# Patient Record
Sex: Male | Born: 1980 | Race: White | Hispanic: No | Marital: Married | Smoking: Current every day smoker
Health system: Southern US, Community
[De-identification: ages and names within clinical notes are randomized; demographics above are authoritative.]

## PROBLEM LIST (undated history)

## (undated) DIAGNOSIS — E119 Type 2 diabetes mellitus without complications: Secondary | ICD-10-CM

## (undated) DIAGNOSIS — M797 Fibromyalgia: Secondary | ICD-10-CM

## (undated) DIAGNOSIS — F259 Schizoaffective disorder, unspecified: Secondary | ICD-10-CM

## (undated) DIAGNOSIS — F1910 Other psychoactive substance abuse, uncomplicated: Secondary | ICD-10-CM

## (undated) DIAGNOSIS — F250 Schizoaffective disorder, bipolar type: Secondary | ICD-10-CM

---

## 2016-11-14 ENCOUNTER — Emergency Department (HOSPITAL_BASED_OUTPATIENT_CLINIC_OR_DEPARTMENT_OTHER): Payer: Medicare Other

## 2016-11-14 ENCOUNTER — Encounter (HOSPITAL_BASED_OUTPATIENT_CLINIC_OR_DEPARTMENT_OTHER): Payer: Self-pay | Admitting: *Deleted

## 2016-11-14 ENCOUNTER — Emergency Department (HOSPITAL_BASED_OUTPATIENT_CLINIC_OR_DEPARTMENT_OTHER)
Admission: EM | Admit: 2016-11-14 | Discharge: 2016-11-14 | Disposition: A | Discharge status: Home / Self Care | Payer: Medicare Other | Source: Home / Self Care | Attending: Emergency Medicine | Admitting: Emergency Medicine

## 2016-11-14 ENCOUNTER — Emergency Department (HOSPITAL_BASED_OUTPATIENT_CLINIC_OR_DEPARTMENT_OTHER)
Admission: EM | Admit: 2016-11-14 | Discharge: 2016-11-15 | Disposition: A | Discharge status: Home / Self Care | Payer: Medicare Other | Attending: Emergency Medicine | Admitting: Emergency Medicine

## 2016-11-14 DIAGNOSIS — R509 Fever, unspecified: Secondary | ICD-10-CM | POA: Insufficient documentation

## 2016-11-14 DIAGNOSIS — F17200 Nicotine dependence, unspecified, uncomplicated: Secondary | ICD-10-CM | POA: Insufficient documentation

## 2016-11-14 DIAGNOSIS — F2089 Other schizophrenia: Secondary | ICD-10-CM

## 2016-11-14 DIAGNOSIS — R062 Wheezing: Secondary | ICD-10-CM

## 2016-11-14 DIAGNOSIS — F1910 Other psychoactive substance abuse, uncomplicated: Secondary | ICD-10-CM | POA: Insufficient documentation

## 2016-11-14 DIAGNOSIS — R55 Syncope and collapse: Secondary | ICD-10-CM

## 2016-11-14 DIAGNOSIS — Z7984 Long term (current) use of oral hypoglycemic drugs: Secondary | ICD-10-CM | POA: Diagnosis not present

## 2016-11-14 DIAGNOSIS — R0789 Other chest pain: Secondary | ICD-10-CM

## 2016-11-14 DIAGNOSIS — R4020 Unspecified coma: Secondary | ICD-10-CM

## 2016-11-14 DIAGNOSIS — E119 Type 2 diabetes mellitus without complications: Secondary | ICD-10-CM

## 2016-11-14 HISTORY — DX: Fibromyalgia: M79.7

## 2016-11-14 HISTORY — DX: Other psychoactive substance abuse, uncomplicated: F19.10

## 2016-11-14 HISTORY — DX: Schizoaffective disorder, unspecified: F25.9

## 2016-11-14 HISTORY — DX: Schizoaffective disorder, bipolar type: F25.0

## 2016-11-14 HISTORY — DX: Type 2 diabetes mellitus without complications: E11.9

## 2016-11-14 LAB — COMPREHENSIVE METABOLIC PANEL
ALT: 39 U/L (ref 17–63)
ANION GAP: 8 (ref 5–15)
AST: 40 U/L (ref 15–41)
Albumin: 3.9 g/dL (ref 3.5–5.0)
Alkaline Phosphatase: 76 U/L (ref 38–126)
BUN: 11 mg/dL (ref 6–20)
CHLORIDE: 106 mmol/L (ref 101–111)
CO2: 26 mmol/L (ref 22–32)
Calcium: 8.8 mg/dL — ABNORMAL LOW (ref 8.9–10.3)
Creatinine, Ser: 1.01 mg/dL (ref 0.61–1.24)
Glucose, Bld: 155 mg/dL — ABNORMAL HIGH (ref 65–99)
POTASSIUM: 3.2 mmol/L — AB (ref 3.5–5.1)
Sodium: 140 mmol/L (ref 135–145)
TOTAL PROTEIN: 7.2 g/dL (ref 6.5–8.1)
Total Bilirubin: 0.5 mg/dL (ref 0.3–1.2)

## 2016-11-14 LAB — CBC WITH DIFFERENTIAL/PLATELET
Basophils Absolute: 0 10*3/uL (ref 0.0–0.1)
Basophils Absolute: 0 10*3/uL (ref 0.0–0.1)
Basophils Relative: 0 %
Basophils Relative: 0 %
EOS PCT: 0 %
EOS PCT: 0 %
Eosinophils Absolute: 0 10*3/uL (ref 0.0–0.7)
Eosinophils Absolute: 0 10*3/uL (ref 0.0–0.7)
HCT: 43.1 % (ref 39.0–52.0)
HCT: 42.1 % (ref 39.0–52.0)
Hemoglobin: 14.5 g/dL (ref 13.0–17.0)
Hemoglobin: 14 g/dL (ref 13.0–17.0)
LYMPHS ABS: 2.3 10*3/uL (ref 0.7–4.0)
LYMPHS PCT: 17 %
Lymphocytes Relative: 14 %
Lymphs Abs: 1.8 10*3/uL (ref 0.7–4.0)
MCH: 29.4 pg (ref 26.0–34.0)
MCH: 29.4 pg (ref 26.0–34.0)
MCHC: 33.6 g/dL (ref 30.0–36.0)
MCHC: 33.3 g/dL (ref 30.0–36.0)
MCV: 87.2 fL (ref 78.0–100.0)
MCV: 88.3 fL (ref 78.0–100.0)
MONO ABS: 1.6 10*3/uL — AB (ref 0.1–1.0)
Monocytes Absolute: 1.3 10*3/uL — ABNORMAL HIGH (ref 0.1–1.0)
Monocytes Relative: 10 %
Monocytes Relative: 12 %
NEUTROS ABS: 7.5 10*3/uL (ref 1.7–7.7)
NEUTROS PCT: 71 %
Neutro Abs: 12.4 10*3/uL — ABNORMAL HIGH (ref 1.7–7.7)
Neutrophils Relative %: 76 %
PLATELETS: 162 10*3/uL (ref 150–400)
PLATELETS: 153 10*3/uL (ref 150–400)
RBC: 4.94 MIL/uL (ref 4.22–5.81)
RBC: 4.77 MIL/uL (ref 4.22–5.81)
RDW: 13.4 % (ref 11.5–15.5)
RDW: 13.6 % (ref 11.5–15.5)
WBC: 16.3 10*3/uL — AB (ref 4.0–10.5)
WBC: 10.7 10*3/uL — AB (ref 4.0–10.5)

## 2016-11-14 LAB — URINALYSIS, ROUTINE W REFLEX MICROSCOPIC
BILIRUBIN URINE: NEGATIVE
Glucose, UA: NEGATIVE mg/dL
Hgb urine dipstick: NEGATIVE
KETONES UR: NEGATIVE mg/dL
Leukocytes, UA: NEGATIVE
NITRITE: NEGATIVE
PROTEIN: NEGATIVE mg/dL
pH: 6.5 (ref 5.0–8.0)

## 2016-11-14 LAB — BASIC METABOLIC PANEL
ANION GAP: 10 (ref 5–15)
BUN: 15 mg/dL (ref 6–20)
CALCIUM: 9.1 mg/dL (ref 8.9–10.3)
CO2: 24 mmol/L (ref 22–32)
CREATININE: 1.24 mg/dL (ref 0.61–1.24)
Chloride: 102 mmol/L (ref 101–111)
GFR calc Af Amer: >60 mL/min (ref >60)
GFR calc non Af Amer: >60 mL/min (ref >60)
GLUCOSE: 93 mg/dL (ref 65–99)
Potassium: 3.8 mmol/L (ref 3.5–5.1)
Sodium: 136 mmol/L (ref 135–145)

## 2016-11-14 LAB — RAPID URINE DRUG SCREEN, HOSP PERFORMED
Amphetamines: NOT DETECTED
BARBITURATES: NOT DETECTED
BENZODIAZEPINES: NOT DETECTED
Cocaine: NOT DETECTED
Opiates: POSITIVE — AB
Tetrahydrocannabinol: NOT DETECTED

## 2016-11-14 LAB — PROTIME-INR
INR: 1
PROTHROMBIN TIME: 13.1 s (ref 11.4–15.2)

## 2016-11-14 LAB — I-STAT CG4 LACTIC ACID, ED: LACTIC ACID, VENOUS: 1.61 mmol/L (ref 0.5–1.9)

## 2016-11-14 MED ORDER — IPRATROPIUM-ALBUTEROL 0.5-2.5 (3) MG/3ML IN SOLN
3.0000 mL | Freq: Four times a day (QID) | RESPIRATORY_TRACT | Status: DC
  Administered 2016-11-14: 3 mL via RESPIRATORY_TRACT
  Filled 2016-11-14: qty 3

## 2016-11-14 MED ORDER — NAPROXEN 500 MG PO TABS: 500.0000 mg | ORAL_TABLET | Freq: Two times a day (BID) | ORAL | 0 refills | Status: AC

## 2016-11-14 MED ORDER — ACETAMINOPHEN 500 MG PO TABS
1000.0000 mg | ORAL_TABLET | Freq: Once | ORAL | Status: AC
  Administered 2016-11-14: 1000 mg via ORAL
  Filled 2016-11-14: qty 2

## 2016-11-14 MED ORDER — SODIUM CHLORIDE 0.9 % IV BOLUS (SEPSIS)
1000.0000 mL | Freq: Once | INTRAVENOUS | Status: AC
  Administered 2016-11-14: 1000 mL via INTRAVENOUS

## 2016-11-14 MED ORDER — ALBUTEROL SULFATE HFA 108 (90 BASE) MCG/ACT IN AERS
2.0000 | INHALATION_SPRAY | Freq: Once | RESPIRATORY_TRACT | Status: AC
  Administered 2016-11-14: 2 via RESPIRATORY_TRACT
  Filled 2016-11-14: qty 6.7

## 2016-11-14 MED ORDER — ALBUTEROL SULFATE HFA 108 (90 BASE) MCG/ACT IN AERS: 2.0000 | INHALATION_SPRAY | RESPIRATORY_TRACT | 0 refills | Status: AC | PRN

## 2016-11-14 NOTE — ED Triage Notes (Signed)
Pt states " my whole chest is riping apart" CPR preformed x 1 day ago , drug overdose and AMA from [facility]

## 2016-11-14 NOTE — ED Notes (Signed)
ED Provider at bedside. 

## 2016-11-14 NOTE — ED Provider Notes (Signed)
[facility] Provider [facility]   CSN: [id number] Arrival date & time: 11/14/16  2203     History   Chief Complaint Chief Complaint  Patient presents with  . Chest wall pain post CPR    HPI Omar Hebert is a 36 y.o. male.  The history is provided by the patient and the spouse.  Chest Pain   This is a new problem. The current episode started 12 to 24 hours ago. The problem occurs constantly. The problem has been gradually worsening. The pain is moderate. The symptoms are aggravated by certain positions and deep breathing. Associated symptoms include cough and a fever. Pertinent negatives include no hemoptysis, no syncope and no vomiting. He has tried nothing for the symptoms.  Pertinent negatives for past medical history include no CAD, no PE and no seizures.  Patient with h/o diabetes, previous IVDA presents with chest wall pain due to recent CPR Apparently he had episodes of unresponsiveness on 9/4 and 9/5, and required CPR by family There was concern for overdose by heroin, which patient denies but he had opiates on UDS and has known h/o IVDA  Today, no syncope No unresponsiveness since last ER visit He has had fever/cough/chest wall pain worse with breathing/cough No hemoptysis  Past Medical History:  Diagnosis Date  . Diabetes mellitus without complication (HCC)   . Fibromyalgia   . IV drug abuse   . Polysubstance abuse   . Schizo-affective schizophrenia (HCC)     There are no active problems to display for this patient.   History reviewed. No pertinent surgical history.     Home Medications    Prior to Admission medications   Medication Sig Start Date End Date Taking? Authorizing Provider  carbamazepine (TEGRETOL XR) 100 MG 12 hr tablet Take 100 mg by mouth 2 (two) times daily.   Yes [provider]  metFORMIN (GLUCOPHAGE) 1000 MG tablet Take 1,000 mg by mouth daily with breakfast.   Yes [provider]  OLANZapine (ZYPREXA) 10 MG  tablet Take 300 mg by mouth at bedtime.   Yes [provider]  albuterol (PROVENTIL HFA;VENTOLIN HFA) 108 (90 Base) MCG/ACT inhaler Inhale 2 puffs into the lungs every 4 (four) hours as needed for wheezing or shortness of breath. 11/14/16   Horton, Mayer Masker, MD  gabapentin (NEURONTIN) 300 MG capsule Take 300 mg by mouth 3 (three) times daily.    [provider]  naproxen (NAPROSYN) 500 MG tablet Take 1 tablet (500 mg total) by mouth 2 (two) times daily. 11/14/16   Horton, Mayer Masker, MD  topiramate (TOPAMAX) 100 MG tablet Take 100 mg by mouth 2 (two) times daily.    [provider]    Family History No family history on file.  Social History Social History  Substance Use Topics  . Smoking status: Current Every Day Smoker    Packs/day: 1.00  . Smokeless tobacco: Not on file  . Alcohol use No     Allergies   Patient has no known allergies.   Review of Systems Review of Systems  Constitutional: Positive for fatigue and fever.  Respiratory: Positive for cough. Negative for hemoptysis.   Cardiovascular: Positive for chest pain. Negative for syncope.  Gastrointestinal: Negative for vomiting.  Skin: Negative for rash.  Neurological: Negative for seizures and syncope.  All other systems reviewed and are negative.    Physical Exam Updated Vital Signs BP 133/86   Pulse (!) 107   Temp (!) 100.8 F (38.2 C) (Oral)  Resp 18   SpO2 97%   Physical Exam CONSTITUTIONAL: Disheveled, no acute distress HEAD: Normocephalic/atraumatic EYES: EOMI/PERRL ENMT: Mucous membranes moist, uvula midline, no erythema/exudates NECK: supple no meningeal signs SPINE/BACK:entire spine nontender CV: S1/S2 noted, no murmurs/rubs/gallops noted, no harsh murmurs noted LUNGS: wheezing bilaterally ABDOMEN: soft, nontender, no rebound or guarding, bowel sounds noted throughout abdomen GU:no cva tenderness NEURO: Pt is awake/alert/appropriate, moves all extremitiesx4.  No facial  droop.   EXTREMITIES: pulses normal/equal, full ROM, ?track marks to arms/feet but no cellulitis/abscesses noted SKIN: warm, color normal PSYCH:anxious  ED Treatments / Results  Labs (all labs ordered are listed, but only abnormal results are displayed) Labs Reviewed  COMPREHENSIVE METABOLIC PANEL - Abnormal; Notable for the following:       Result Value   Potassium 3.2 (*)    Glucose, Bld 155 (*)    Calcium 8.8 (*)    All other components within normal limits  CBC WITH DIFFERENTIAL/PLATELET - Abnormal; Notable for the following:    WBC 10.7 (*)    Monocytes Absolute 1.3 (*)    All other components within normal limits  URINALYSIS, ROUTINE W REFLEX MICROSCOPIC - Abnormal; Notable for the following:    Specific Gravity, Urine <1.005 (*)    All other components within normal limits  CULTURE, BLOOD (ROUTINE X 2)  CULTURE, BLOOD (ROUTINE X 2)  PROTIME-INR  I-STAT CG4 LACTIC ACID, ED    EKG  EKG Interpretation  Date/Time:  Wednesday November 14 2016 22:17:52 EDT Ventricular Rate:  105 PR Interval:    QRS Duration: 83 QT Interval:  341 QTC Calculation: 451 R Axis:   27 Text Interpretation:  Sinus tachycardia Probable left atrial enlargement Borderline T abnormalities, anterior leads Confirmed by Zadie Rhine ([id number]) on 11/14/2016 11:07:03 PM       Radiology Dg Chest 2 View  Result Date: 11/14/2016 CLINICAL DATA:  Pt was unresponsive went to [facility] 1 day ago, left AMA and had another episode of unresponsiveness at home where Mother and wife performed CPR on him. Pt was seen early this AM for post CPR treatment. Patient now states that his whole chest feels like it is ripping apart. Feels like he has broken ribs. History of drug use, schizophrenia, diabetes, fibromyalgia, smoker. EXAM: CHEST  2 VIEW COMPARISON:  11/14/2016 at 0540 hours FINDINGS: Normal heart size and pulmonary vascularity. No focal airspace disease or consolidation in the lungs. No blunting of  costophrenic angles. No pneumothorax. Mediastinal contours appear intact. IMPRESSION: No active cardiopulmonary disease. Electronically Signed   By: Burman Nieves M.D.   On: 11/14/2016 23:15   Dg Chest 2 View  Result Date: 11/14/2016 CLINICAL DATA:  Multiple nonresponsive episodes over 2 days. Mid chest pain. Cough. Smoker. EXAM: CHEST  2 VIEW COMPARISON:  11/13/2016 from [facility] FINDINGS: The heart size and mediastinal contours are within normal limits. Both lungs are clear. The visualized skeletal structures are unremarkable. IMPRESSION: No active cardiopulmonary disease. Electronically Signed   By: Burman Nieves M.D.   On: 11/14/2016 06:15    Procedures Procedures (including critical care time)  Medications Ordered in ED Medications  ipratropium-albuterol (DUONEB) 0.5-2.5 (3) MG/3ML nebulizer solution 3 mL (3 mLs Nebulization Given 11/14/16 2322)  acetaminophen (TYLENOL) tablet 1,000 mg (1,000 mg Oral Given 11/14/16 2227)  sodium chloride 0.9 % bolus 1,000 mL (0 mLs Intravenous Stopped 11/15/16 0046)  ketorolac (TORADOL) 30 MG/ML injection 30 mg (30 mg Intravenous Given 11/15/16 0014)  cefTRIAXone (ROCEPHIN) 1 g in  dextrose 5 % 50 mL IVPB (0 g Intravenous Stopped 11/15/16 0046)  azithromycin (ZITHROMAX) tablet 500 mg (500 mg Oral Given 11/15/16 0014)     Initial Impression / Assessment and Plan / ED Course  I have reviewed the triage vital signs and the nursing notes.  Pertinent labs & imaging results that were available during my care of the patient were reviewed by me and considered in my medical decision making (see chart for details).     Pt stable He is improved He is ambulatory, no distress No hypoxia He is here for chest wall pain from recent CPR, but no rib fractures However given fever/cough/wheezing, likely respiratory infectious etiology Will start on antibiotics for CAP Pt is very unreliable, denying use of IV heroin when it has been well  documented However, he is insisting on leaving, he is awake/alert, and appears appropriate and nontoxic  We discussed strict ER return precautions   Final Clinical Impressions(s) / ED Diagnoses   Final diagnoses:  Chest wall pain  Acute febrile illness    New Prescriptions New Prescriptions   AZITHROMYCIN (ZITHROMAX) 250 MG TABLET    One tablet PO daily for 4 days     Zadie RhineWickline, Luvia Orzechowski, MD 11/15/16 [phone]

## 2016-11-14 NOTE — Discharge Instructions (Signed)
You were seen today after an episode of loss of consciousness and reported CPR. He had chest wall pain. Your chest x-ray and EKG are reassuring. Take naproxen as needed for pain. Your loss of consciousness baby related to opiate abuse. Avoid street drugs as they can make you unintentionally overdosed and worsen your schizophrenia. You were noted to have some wheezing. Use inhaler every 4 hours as needed.

## 2016-11-14 NOTE — ED Triage Notes (Addendum)
Pt states he was unresponsive yesterday at 1500, went to [facility],  Left [facility] went home went to sleep and became unresponsive  Wife and mother did cpr on him  Went back to [facility], left [facility] and came here,  States also has cough, ha, sore on inside of mouth and feels like broken ribs   Pt a&o  Ambulatory without diff

## 2016-11-14 NOTE — ED Provider Notes (Signed)
[facility]   CSN: [id number] Arrival date & time: 11/14/16  0430     History   Chief Complaint Chief Complaint  Patient presents with  . Loss of Consciousness    HPI Omar Hebert is a 36 y.o. male.  HPI  This is a 36 year old male with history of diabetes, schizophrenia who presents with reported loss of consciousness. Patient reports 2 episodes of loss of consciousness.  Initial episode was yesterday at 3 PM. He was evaluated by EMS. Minimally responsive to Narcan. [facility]. Unfortunately, patient left AGAINST MEDICAL ADVICE prior to definitive care. [facility] he was in no acute distress. Breathing on his own. He removed his own IV and left because he was not happy with his care. He reports that he went home and had another episode of unresponsiveness prompting his wife to give him CPR. [facility]. He sat in the waiting room for greater than 7 hours per the patient. He had no recurrence of symptoms while there. He left because "they wouldn't do anything for me." Currently he is complaining of chest discomfort. He thinks that he may have broken some ribs. Denies any shortness of breath. Initially, patient denied any recent drug use. However, upon further questioning, patient did do heroin prior to the initial episode of loss of consciousness.  Patient with history of schizophrenia. Reports taking his medications. Denies hallucinations. Denies suicidal ideation or homicidal ideation.  Past Medical History:  Diagnosis Date  . Diabetes mellitus without complication (HCC)   . Fibromyalgia   . Schizo-affective schizophrenia (HCC)     There are no active problems to display for this patient.   No past surgical history on file.     Home Medications    Prior to Admission medications   Medication Sig Start Date End Date Taking? Authorizing Provider  gabapentin (NEURONTIN) 300 MG capsule  Take 300 mg by mouth 3 (three) times daily.   Yes [provider]  topiramate (TOPAMAX) 100 MG tablet Take 100 mg by mouth 2 (two) times daily.   Yes [provider]  albuterol (PROVENTIL HFA;VENTOLIN HFA) 108 (90 Base) MCG/ACT inhaler Inhale 2 puffs into the lungs every 4 (four) hours as needed for wheezing or shortness of breath. 11/14/16   Tiani Stanbery, Mayer Maskerourtney F, MD  naproxen (NAPROSYN) 500 MG tablet Take 1 tablet (500 mg total) by mouth 2 (two) times daily. 11/14/16   Oshua Mcconaha, Mayer Maskerourtney F, MD    Family History No family history on file.  Social History Social History  Substance Use Topics  . Smoking status: Not on file  . Smokeless tobacco: Not on file  . Alcohol use Not on file     Allergies   Patient has no known allergies.   Review of Systems Review of Systems  Constitutional: Negative for fever.  Respiratory: Positive for wheezing. Negative for shortness of breath.   Cardiovascular: Positive for chest pain.  Gastrointestinal: Negative for abdominal pain and vomiting.  Genitourinary: Negative for dysuria.  Neurological: Negative for seizures.       Loss of consciousness  Psychiatric/Behavioral: Negative for hallucinations and suicidal ideas. The patient is nervous/anxious.   All other systems reviewed and are negative.    Physical Exam Updated Vital Signs BP (!) 141/89 (BP Location: Right Arm)   Pulse 98   Temp 99 F (37.2 C) (Oral)   Resp 16   Ht 5\' 9"  (1.753 m)  Wt 105.2 kg (232 lb)   SpO2 96%   BMI 34.26 kg/m   Physical Exam  Constitutional: He is oriented to person, place, and time. No distress.  Disheveled appearing, overweight, no acute distress  HENT:  Head: Normocephalic and atraumatic.  Eyes: Pupils are equal, round, and reactive to light.  Pupils 3 mm and reactive bilaterally  Neck: Neck supple.  Cardiovascular: Normal rate, regular rhythm and normal heart sounds.   No murmur heard. Pulmonary/Chest: Effort normal. No respiratory  distress. He has wheezes. He exhibits tenderness.  Diffuse expiratory wheezing, no crepitus  Abdominal: Soft. Bowel sounds are normal. There is no tenderness. There is no rebound.  Musculoskeletal: He exhibits no edema.  Neurological: He is alert and oriented to person, place, and time.  Cranial nerves II through XII intact, 5 out of 5 strength in all 4 extremities  Skin: Skin is warm and dry.  Psychiatric:  Bizarre affect, tangential speech  Nursing note and vitals reviewed.    ED Treatments / Results  Labs (all labs ordered are listed, but only abnormal results are displayed) Labs Reviewed  RAPID URINE DRUG SCREEN, HOSP PERFORMED - Abnormal; Notable for the following:       Result Value   Opiates POSITIVE (*)    All other components within normal limits  CBC WITH DIFFERENTIAL/PLATELET - Abnormal; Notable for the following:    WBC 16.3 (*)    Neutro Abs 12.4 (*)    Monocytes Absolute 1.6 (*)    All other components within normal limits  BASIC METABOLIC PANEL    EKG  EKG Interpretation  Date/Time:  Wednesday November 14 2016 05:39:29 EDT Ventricular Rate:  87 PR Interval:    QRS Duration: 84 QT Interval:  376 QTC Calculation: 453 R Axis:   37 Text Interpretation:  Sinus rhythm Confirmed by Ross Marcus ([id number]) on 11/14/2016 6:06:19 AM       Radiology Dg Chest 2 View  Result Date: 11/14/2016 CLINICAL DATA:  Multiple nonresponsive episodes over 2 days. Mid chest pain. Cough. Smoker. EXAM: CHEST  2 VIEW COMPARISON:  11/13/2016 from [facility] FINDINGS: The heart size and mediastinal contours are within normal limits. Both lungs are clear. The visualized skeletal structures are unremarkable. IMPRESSION: No active cardiopulmonary disease. Electronically Signed   By: Burman Nieves M.D.   On: 11/14/2016 06:15    Procedures Procedures (including critical care time)  Medications Ordered in ED Medications  ipratropium-albuterol (DUONEB) 0.5-2.5 (3)  MG/3ML nebulizer solution 3 mL (3 mLs Nebulization Given 11/14/16 0554)  albuterol (PROVENTIL HFA;VENTOLIN HFA) 108 (90 Base) MCG/ACT inhaler 2 puff (2 puffs Inhalation Given 11/14/16 0649)     Initial Impression / Assessment and Plan / ED Course  I have reviewed the triage vital signs and the nursing notes.  Pertinent labs & imaging results that were available during my care of the patient were reviewed by me and considered in my medical decision making (see chart for details).     Patient presents reporting loss of consciousness and CPR at home. He was initially evaluated at [facility]. He finally admitted to using opiates prior to the initial loss of consciousness. On physical exam he is neurologically intact. He has diffuse wheezing. Vital signs are reassuring. EKG is nonischemic. Chest x-ray shows no evidence of pneumothorax or broken ribs. Lab work is largely unremarkable. He does have a white count of 16 which is down trending from [facility]. Discussed with patient that he should avoid using  street drugs given his schizophrenia and increased risk for unintentional overdose. Patient was encouraged follow-up with his counselor.  After history, exam, and medical workup I feel the patient has been appropriately medically screened and is safe for discharge home. Pertinent diagnoses were discussed with the patient. Patient was given return precautions.   Final Clinical Impressions(s) / ED Diagnoses   Final diagnoses:  Loss of consciousness (HCC)  Polysubstance abuse  Chest wall pain  Wheezing    New Prescriptions New Prescriptions   ALBUTEROL (PROVENTIL HFA;VENTOLIN HFA) 108 (90 BASE) MCG/ACT INHALER    Inhale 2 puffs into the lungs every 4 (four) hours as needed for wheezing or shortness of breath.   NAPROXEN (NAPROSYN) 500 MG TABLET    Take 1 tablet (500 mg total) by mouth 2 (two) times daily.     Shon Baton, MD 11/14/16 [phone]

## 2016-11-14 NOTE — ED Notes (Signed)
Patient transported to X-ray 

## 2016-11-15 ENCOUNTER — Encounter (HOSPITAL_BASED_OUTPATIENT_CLINIC_OR_DEPARTMENT_OTHER): Payer: Self-pay | Admitting: *Deleted

## 2016-11-15 ENCOUNTER — Emergency Department (HOSPITAL_BASED_OUTPATIENT_CLINIC_OR_DEPARTMENT_OTHER)
Admission: EM | Admit: 2016-11-15 | Discharge: 2016-11-15 | Disposition: A | Discharge status: Home / Self Care | Payer: Medicare Other | Source: Home / Self Care

## 2016-11-15 DIAGNOSIS — Z5321 Procedure and treatment not carried out due to patient leaving prior to being seen by health care provider: Secondary | ICD-10-CM

## 2016-11-15 DIAGNOSIS — R05 Cough: Secondary | ICD-10-CM

## 2016-11-15 DIAGNOSIS — R9431 Abnormal electrocardiogram [ECG] [EKG]: Secondary | ICD-10-CM

## 2016-11-15 DIAGNOSIS — R079 Chest pain, unspecified: Secondary | ICD-10-CM

## 2016-11-15 MED ORDER — DEXTROSE 5 % IV SOLN
1.0000 g | Freq: Once | INTRAVENOUS | Status: AC
  Administered 2016-11-15: 1 g via INTRAVENOUS
  Filled 2016-11-15: qty 10

## 2016-11-15 MED ORDER — KETOROLAC TROMETHAMINE 30 MG/ML IJ SOLN
30.0000 mg | Freq: Once | INTRAMUSCULAR | Status: AC
  Administered 2016-11-15: 30 mg via INTRAVENOUS
  Filled 2016-11-15: qty 1

## 2016-11-15 MED ORDER — AZITHROMYCIN 250 MG PO TABS: ORAL_TABLET | ORAL | 0 refills | Status: AC

## 2016-11-15 MED ORDER — AZITHROMYCIN 250 MG PO TABS
500.0000 mg | ORAL_TABLET | Freq: Once | ORAL | Status: AC
  Administered 2016-11-15: 500 mg via ORAL
  Filled 2016-11-15: qty 2

## 2016-11-15 NOTE — ED Notes (Signed)
Called with no response

## 2016-11-15 NOTE — ED Notes (Signed)
ED Provider at bedside discussing test results and dispo plan of care. 

## 2016-11-15 NOTE — ED Notes (Signed)
No answer. Pt was seen walking toward the parking lot after triage.

## 2016-11-15 NOTE — ED Triage Notes (Addendum)
Cough and chest pain. He went his MD today and was told to come here for abnormal EKG. EKG repeated at triage shows NSR. No distress at triage. He had a CXR today in his doctors office.

## 2016-11-15 NOTE — ED Notes (Signed)
Called to room No answer

## 2016-11-20 LAB — CULTURE, BLOOD (ROUTINE X 2)
CULTURE: NO GROWTH
Culture: NO GROWTH
SPECIAL REQUESTS: ADEQUATE
SPECIAL REQUESTS: ADEQUATE

## 2017-07-24 ENCOUNTER — Other Ambulatory Visit: Payer: Self-pay

## 2017-07-24 ENCOUNTER — Encounter (HOSPITAL_BASED_OUTPATIENT_CLINIC_OR_DEPARTMENT_OTHER): Payer: Self-pay | Admitting: *Deleted

## 2017-07-24 ENCOUNTER — Emergency Department (HOSPITAL_BASED_OUTPATIENT_CLINIC_OR_DEPARTMENT_OTHER)
Admission: EM | Admit: 2017-07-24 | Discharge: 2017-07-24 | Disposition: A | Discharge status: Home / Self Care | Payer: Medicare Other | Attending: Emergency Medicine | Admitting: Emergency Medicine

## 2017-07-24 ENCOUNTER — Emergency Department (HOSPITAL_BASED_OUTPATIENT_CLINIC_OR_DEPARTMENT_OTHER): Payer: Medicare Other

## 2017-07-24 DIAGNOSIS — Z79899 Other long term (current) drug therapy: Secondary | ICD-10-CM | POA: Diagnosis not present

## 2017-07-24 DIAGNOSIS — E119 Type 2 diabetes mellitus without complications: Secondary | ICD-10-CM | POA: Insufficient documentation

## 2017-07-24 DIAGNOSIS — M25562 Pain in left knee: Secondary | ICD-10-CM

## 2017-07-24 DIAGNOSIS — Z7984 Long term (current) use of oral hypoglycemic drugs: Secondary | ICD-10-CM | POA: Diagnosis not present

## 2017-07-24 DIAGNOSIS — F17200 Nicotine dependence, unspecified, uncomplicated: Secondary | ICD-10-CM | POA: Diagnosis not present

## 2017-07-24 MED ORDER — KETOROLAC TROMETHAMINE 60 MG/2ML IM SOLN
30.0000 mg | Freq: Once | INTRAMUSCULAR | Status: AC
  Administered 2017-07-24: 30 mg via INTRAMUSCULAR
  Filled 2017-07-24: qty 2

## 2017-07-24 MED ORDER — LIDOCAINE 5 % EX PTCH: 1.0000 | MEDICATED_PATCH | CUTANEOUS | 0 refills | Status: AC

## 2017-07-24 NOTE — ED Provider Notes (Signed)
[facility] EMERGENCY DEPARTMENT Provider Note   CSN: [id number] Arrival date & time: 07/24/17  0228     History   Chief Complaint Chief Complaint  Patient presents with  . Leg Pain    HPI Gianno Volner is a 37 y.o. male.  The history is provided by the patient.  Leg Pain   This is a chronic problem. The current episode started more than 1 week ago (100 days ago). The problem occurs constantly. The problem has not changed since onset.The pain is present in the left knee and left lower leg. The quality of the pain is described as aching. The pain is severe. Pertinent negatives include no numbness, full range of motion, no stiffness, no tingling and no itching. The symptoms are aggravated by activity. He has tried nothing for the symptoms. The treatment provided no relief. There has been no history of extremity trauma. Family history is significant for no rheumatoid arthritis.  Knee Pain   This is a chronic problem. The current episode started more than 1 week ago. The problem occurs constantly. The problem has not changed since onset.The pain is present in the left knee. The quality of the pain is described as aching. The pain is severe. Pertinent negatives include no numbness, full range of motion, no stiffness, no tingling and no itching. He has tried nothing for the symptoms. The treatment provided no relief. There has been no history of extremity trauma. Family history is significant for no rheumatoid arthritis.    Past Medical History:  Diagnosis Date  . Diabetes mellitus without complication (HCC)   . Fibromyalgia   . IV drug abuse (HCC)   . Polysubstance abuse (HCC)   . Schizo-affective schizophrenia (HCC)     There are no active problems to display for this patient.   History reviewed. No pertinent surgical history.      Home Medications    Prior to Admission medications   Medication Sig Start Date End Date Taking? Authorizing Provider  albuterol  (PROVENTIL HFA;VENTOLIN HFA) 108 (90 Base) MCG/ACT inhaler Inhale 2 puffs into the lungs every 4 (four) hours as needed for wheezing or shortness of breath. 11/14/16   Horton, Mayer Masker, MD  azithromycin (ZITHROMAX) 250 MG tablet One tablet PO daily for 4 days 11/15/16   Zadie Rhine, MD  carbamazepine (TEGRETOL XR) 100 MG 12 hr tablet Take 100 mg by mouth 2 (two) times daily.    [provider]  gabapentin (NEURONTIN) 300 MG capsule Take 300 mg by mouth 3 (three) times daily.    [provider]  metFORMIN (GLUCOPHAGE) 1000 MG tablet Take 1,000 mg by mouth daily with breakfast.    [provider]  naproxen (NAPROSYN) 500 MG tablet Take 1 tablet (500 mg total) by mouth 2 (two) times daily. 11/14/16   Horton, Mayer Masker, MD  OLANZapine (ZYPREXA) 10 MG tablet Take 300 mg by mouth at bedtime.    [provider]  topiramate (TOPAMAX) 100 MG tablet Take 100 mg by mouth 2 (two) times daily.    [provider]    Family History History reviewed. No pertinent family history.  Social History Social History   Tobacco Use  . Smoking status: Current Every Day Smoker    Packs/day: 1.00  . Smokeless tobacco: Never Used  Substance Use Topics  . Alcohol use: No  . Drug use: No     Allergies   Patient has no known allergies.   Review of Systems Review of  Systems  Constitutional: Negative for chills and fever.  Respiratory: Negative for cough and shortness of breath.   Cardiovascular: Negative for chest pain, palpitations and leg swelling.  Genitourinary: Negative for dysuria.  Musculoskeletal: Positive for arthralgias. Negative for back pain, gait problem, joint swelling, neck pain, neck stiffness and stiffness.  Skin: Negative for color change, itching, pallor and rash.  Neurological: Negative for tingling and numbness.  All other systems reviewed and are negative.    Physical Exam Updated Vital Signs BP (!) 151/104 (BP Location: Left Arm)    Pulse 82   Temp 99 F (37.2 C) (Oral)   Resp 18   Ht  (1.727 m)   Wt 95.3 kg (210 lb)   SpO2 100%   BMI 31.93 kg/m   Physical Exam  Constitutional: He is oriented to person, place, and time. He appears well-developed and well-nourished. No distress.  HENT:  Head: Normocephalic and atraumatic.  Nose: Nose normal.  Mouth/Throat: No oropharyngeal exudate.  Eyes: Pupils are equal, round, and reactive to light. Conjunctivae are normal.  Neck: Normal range of motion. Neck supple.  Cardiovascular: Normal rate, regular rhythm, normal heart sounds and intact distal pulses.  No murmur heard. Pulmonary/Chest: Effort normal and breath sounds normal. No stridor. He has no wheezes. He has no rales.  Abdominal: Soft. Bowel sounds are normal. He exhibits no mass. There is no tenderness. There is no rebound and no guarding.  Musculoskeletal: Normal range of motion. He exhibits no tenderness or deformity.       Left hip: Normal.       Right knee: Normal.       Left knee: Normal. He exhibits normal range of motion, no swelling, no effusion, no ecchymosis, no deformity, no laceration, no erythema, normal alignment, no LCL laxity, normal patellar mobility, no bony tenderness, normal meniscus and no MCL laxity. No tenderness found. No medial joint line, no lateral joint line, no MCL, no LCL and no patellar tendon tenderness noted.       Right ankle: Normal.       Left ankle: Normal. He exhibits normal range of motion, no swelling, no ecchymosis, no deformity, no laceration and normal pulse. No tenderness. No lateral malleolus, no medial malleolus, no AITFL, no CF ligament, no posterior TFL, no head of 5th metatarsal and no proximal fibula tenderness found. Achilles tendon normal.       Right upper leg: Normal.       Left upper leg: Normal.       Right lower leg: Normal.       Left lower leg: Normal.       Right foot: Normal.       Left foot: Normal.  No wounds on the skin of either lower extremity.   No patella alta or baja.  Negative Lachmann's test B.  Negative Homan's sign.  Intact L5/s1 Intact gait 3 + dorsalis pedis b,  No osler nodes no Janeway lesions no splinter hemorrhages   Neurological: He is alert and oriented to person, place, and time. He displays normal reflexes. No sensory deficit. He exhibits normal muscle tone.  Skin: Skin is warm and dry. Capillary refill takes less than 2 seconds.  Psychiatric: He has a normal mood and affect.     ED Treatments / Results   Radiology Results for orders placed or performed during the hospital encounter of 11/14/16  Culture, blood (Routine x 2)  Result Value Ref Range   Specimen Description BLOOD BLOOD RIGHT HAND  Special Requests      BOTTLES DRAWN AEROBIC AND ANAEROBIC Blood Culture adequate volume   Culture      NO GROWTH 5 DAYS Performed at [facility], [address], [place], [place] [address]    Report Status 11/20/2016 FINAL   Culture, blood (Routine x 2)  Result Value Ref Range   Specimen Description BLOOD BLOOD LEFT FOREARM    Special Requests      BOTTLES DRAWN AEROBIC AND ANAEROBIC Blood Culture adequate volume   Culture      NO GROWTH 5 DAYS Performed at [facility], [address], [place], [place] [address]    Report Status 11/20/2016 FINAL   Comprehensive metabolic panel  Result Value Ref Range   Sodium 140 135 - 145 mmol/L   Potassium 3.2 (L) 3.5 - 5.1 mmol/L   Chloride 106 101 - 111 mmol/L   CO2 26 22 - 32 mmol/L   Glucose, Bld 155 (H) 65 - 99 mg/dL   BUN 11 6 - 20 mg/dL   Creatinine, Ser 0.98 0.61 - 1.24 mg/dL   Calcium 8.8 (L) 8.9 - 10.3 mg/dL   Total Protein 7.2 6.5 - 8.1 g/dL   Albumin 3.9 3.5 - 5.0 g/dL   AST 40 15 - 41 U/L   ALT 39 17 - 63 U/L   Alkaline Phosphatase 76 38 - 126 U/L   Total Bilirubin 0.5 0.3 - 1.2 mg/dL   GFR calc non Af Amer >60 >60 mL/min   GFR calc Af Amer >60 >60 mL/min   Anion gap 8 5 - 15  CBC with Differential  Result Value Ref Range   WBC 10.7  (H) 4.0 - 10.5 K/uL   RBC 4.77 4.22 - 5.81 MIL/uL   Hemoglobin 14.0 13.0 - 17.0 g/dL   HCT 11.9 14.7 - 82.9 %   MCV 88.3 78.0 - 100.0 fL   MCH 29.4 26.0 - 34.0 pg   MCHC 33.3 30.0 - 36.0 g/dL   RDW 56.2 13.0 - 86.5 %   Platelets 153 150 - 400 K/uL   Neutrophils Relative % 71 %   Neutro Abs 7.5 1.7 - 7.7 K/uL   Lymphocytes Relative 17 %   Lymphs Abs 1.8 0.7 - 4.0 K/uL   Monocytes Relative 12 %   Monocytes Absolute 1.3 (H) 0.1 - 1.0 K/uL   Eosinophils Relative 0 %   Eosinophils Absolute 0.0 0.0 - 0.7 K/uL   Basophils Relative 0 %   Basophils Absolute 0.0 0.0 - 0.1 K/uL  Protime-INR  Result Value Ref Range   Prothrombin Time 13.1 11.4 - 15.2 seconds   INR 1.00   Urinalysis, Routine w reflex microscopic  Result Value Ref Range   Color, Urine YELLOW YELLOW   APPearance CLEAR CLEAR   Specific Gravity, Urine <1.005 (L) 1.005 - 1.030   pH 6.5 5.0 - 8.0   Glucose, UA NEGATIVE NEGATIVE mg/dL   Hgb urine dipstick NEGATIVE NEGATIVE   Bilirubin Urine NEGATIVE NEGATIVE   Ketones, ur NEGATIVE NEGATIVE mg/dL   Protein, ur NEGATIVE NEGATIVE mg/dL   Nitrite NEGATIVE NEGATIVE   Leukocytes, UA NEGATIVE NEGATIVE  I-Stat CG4 Lactic Acid, ED  Result Value Ref Range   Lactic Acid, Venous 1.61 0.5 - 1.9 mmol/L   Dg Tibia/fibula Left  Result Date: 07/24/2017 CLINICAL DATA:  Chronic left lower leg pain. EXAM: LEFT TIBIA AND FIBULA - 2 VIEW COMPARISON:  None. FINDINGS: There is no evidence of fracture or dislocation. The  tibia and fibula appear intact. The ankle mortise is incompletely assessed, but appears grossly unremarkable. The knee joint is grossly unremarkable in appearance. No knee joint effusion is identified. No definite soft tissue abnormalities are characterized on radiograph. The subtalar joint is unremarkable in appearance. IMPRESSION: No evidence of fracture or dislocation. Electronically Signed   By: Roanna Raider M.D.   On: 07/24/2017 03:32   Dg Knee Complete 4 Views Left  Result  Date: 07/24/2017 CLINICAL DATA:  Chronic left knee pain. EXAM: LEFT KNEE - COMPLETE 4+ VIEW COMPARISON:  None. FINDINGS: There is no evidence of fracture or dislocation. The joint spaces are preserved. No significant degenerative change is seen; the patellofemoral joint is grossly unremarkable in appearance. No significant joint effusion is seen. The visualized soft tissues are normal in appearance. IMPRESSION: No evidence of fracture or dislocation. Electronically Signed   By: Roanna Raider M.D.   On: 07/24/2017 03:31    Procedures Procedures (including critical care time)  Medications Ordered in ED Medications  ketorolac (TORADOL) injection 30 mg (has no administration in time range)      Final Clinical Impressions(s) / ED Diagnoses   No effusions, no fractures no signs of infection.  Symptoms are chronic and patient has an appointment with orthopedics at this time.  Exam vitals and imaging are benign and reassuring.  Highly doubt systemic infection or thrombus.  Has an appointment tomorrow with [facility].  Keep this appointment.  Will prescribe lidoderm patches.    Return for weakness, numbness, changes in vision or speech, fevers >100.4 unrelieved by medication, shortness of breath, intractable vomiting, or diarrhea, abdominal pain, Inability to tolerate liquids or food, cough, altered mental status or any concerns. No signs of systemic illness or infection. The patient is nontoxic-appearing on exam and vital signs are within normal limits.   I have reviewed the triage vital signs and the nursing notes. Pertinent labs &imaging results that were available during my care of the patient were reviewed by me and considered in my medical decision making (see chart for details).  After history, exam, and medical workup I feel the patient has been appropriately medically screened and is safe for discharge home. Pertinent diagnoses were discussed with the patient. Patient was given  return precautions.    Deaundre Allston, MD 07/24/17 [phone]

## 2017-07-24 NOTE — ED Triage Notes (Signed)
Pt reports left leg pain x several months. Ambulatory. Denies known injury.

## 2017-07-24 NOTE — ED Notes (Addendum)
Pt c/o left thigh, outer knee and shin pain for the last 100 days.  Pt has an appointment on Thursday with ortho.  Achilles in tact, able to flex and extend both legs without issue.  No evidence of redness, no swelling, no warmth, no trauma, no bruising.

## 2020-01-06 IMAGING — DX DG KNEE COMPLETE 4+V*L*
4 series · 4 of 4 positions shown · non-contrast
Comparison: None.

CLINICAL DATA: Chronic left knee pain.

EXAM:
LEFT KNEE - COMPLETE 4+ VIEW

[knee ap]
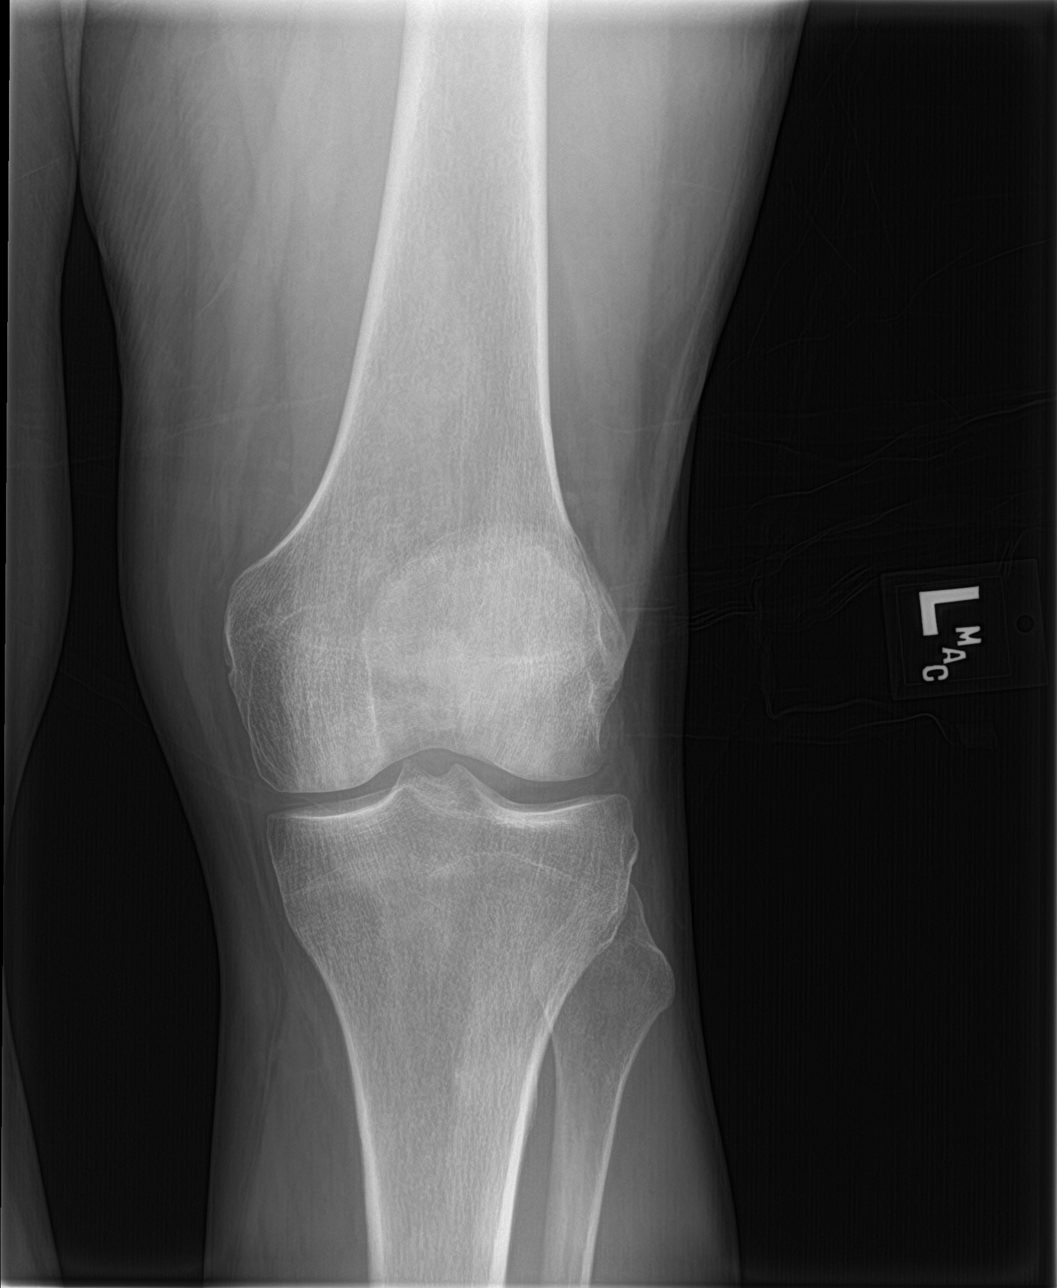

[tunnel]
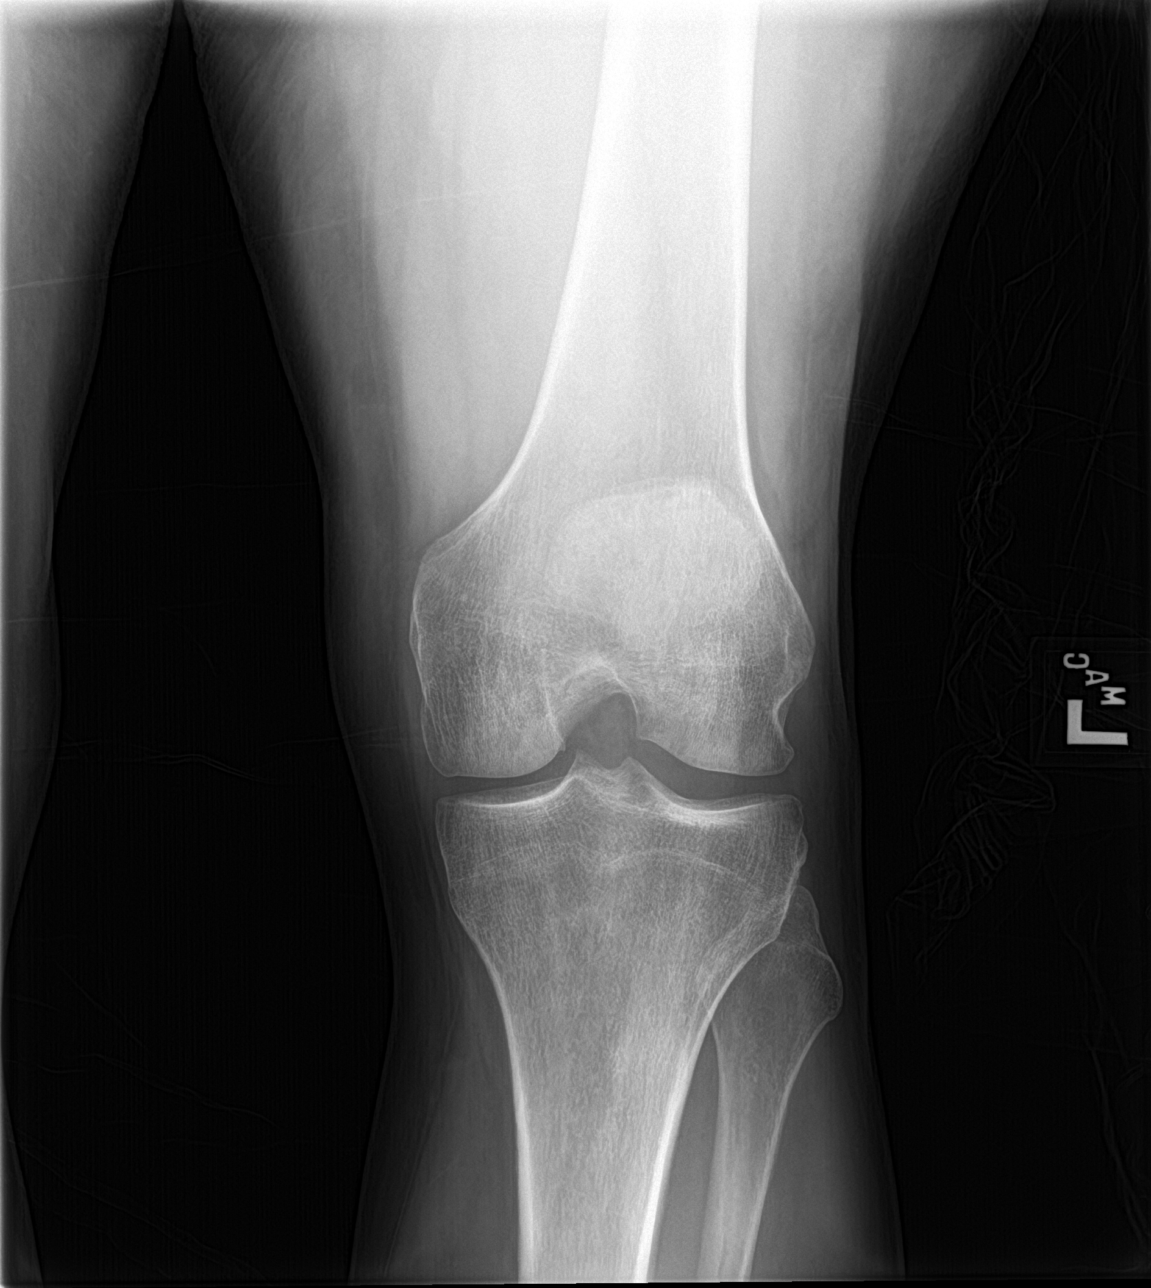

[knee lat]
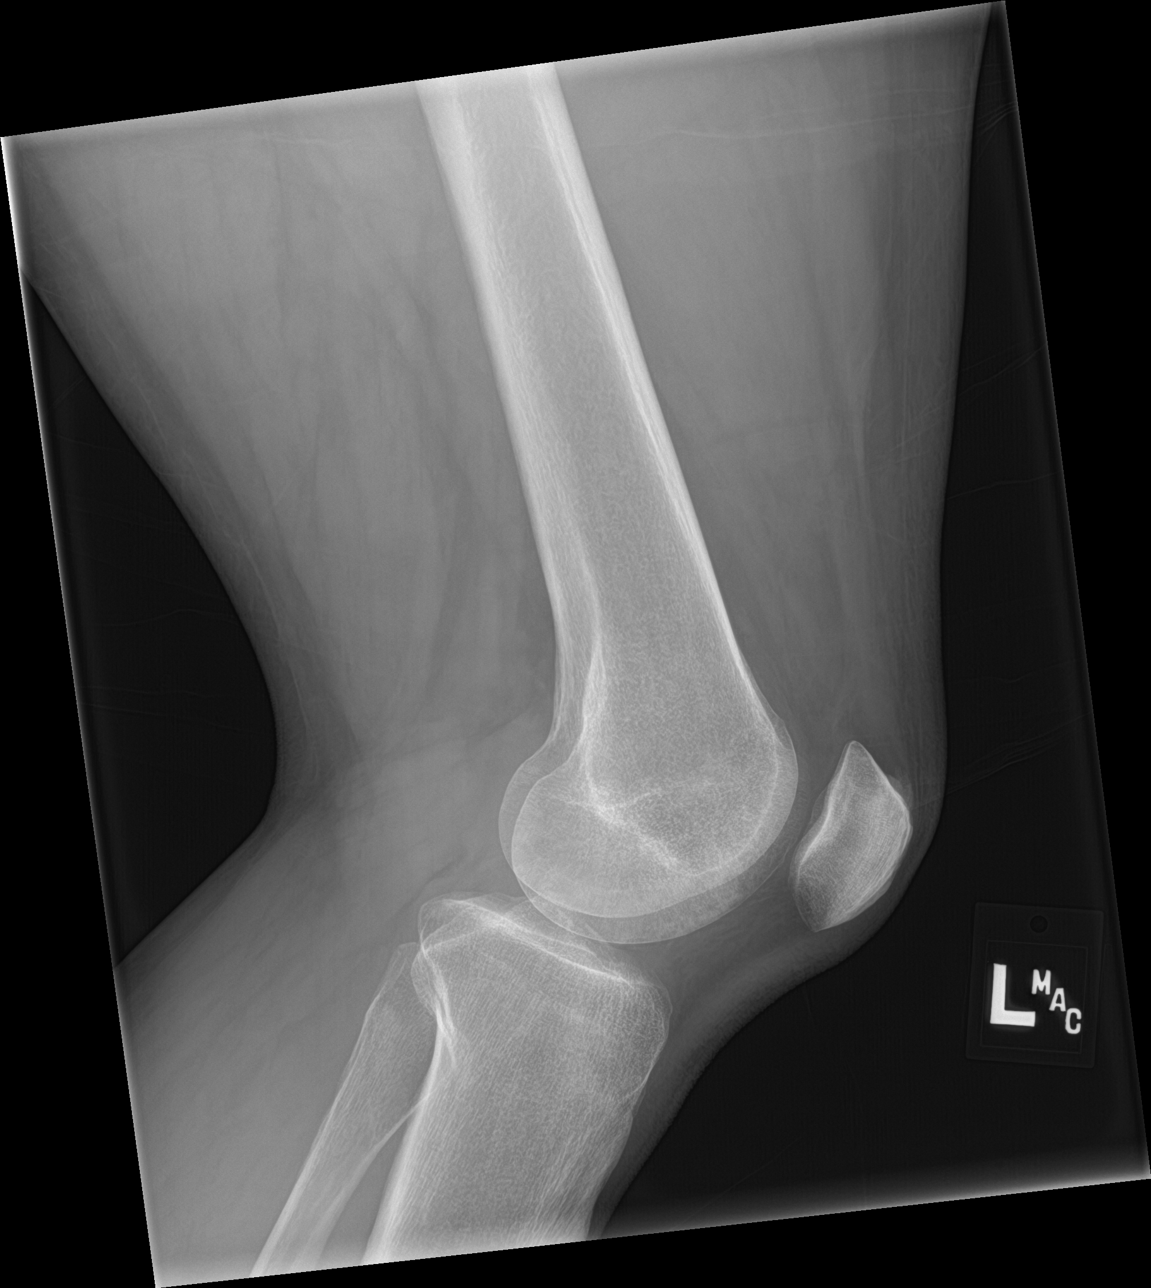

[knee sunrise]
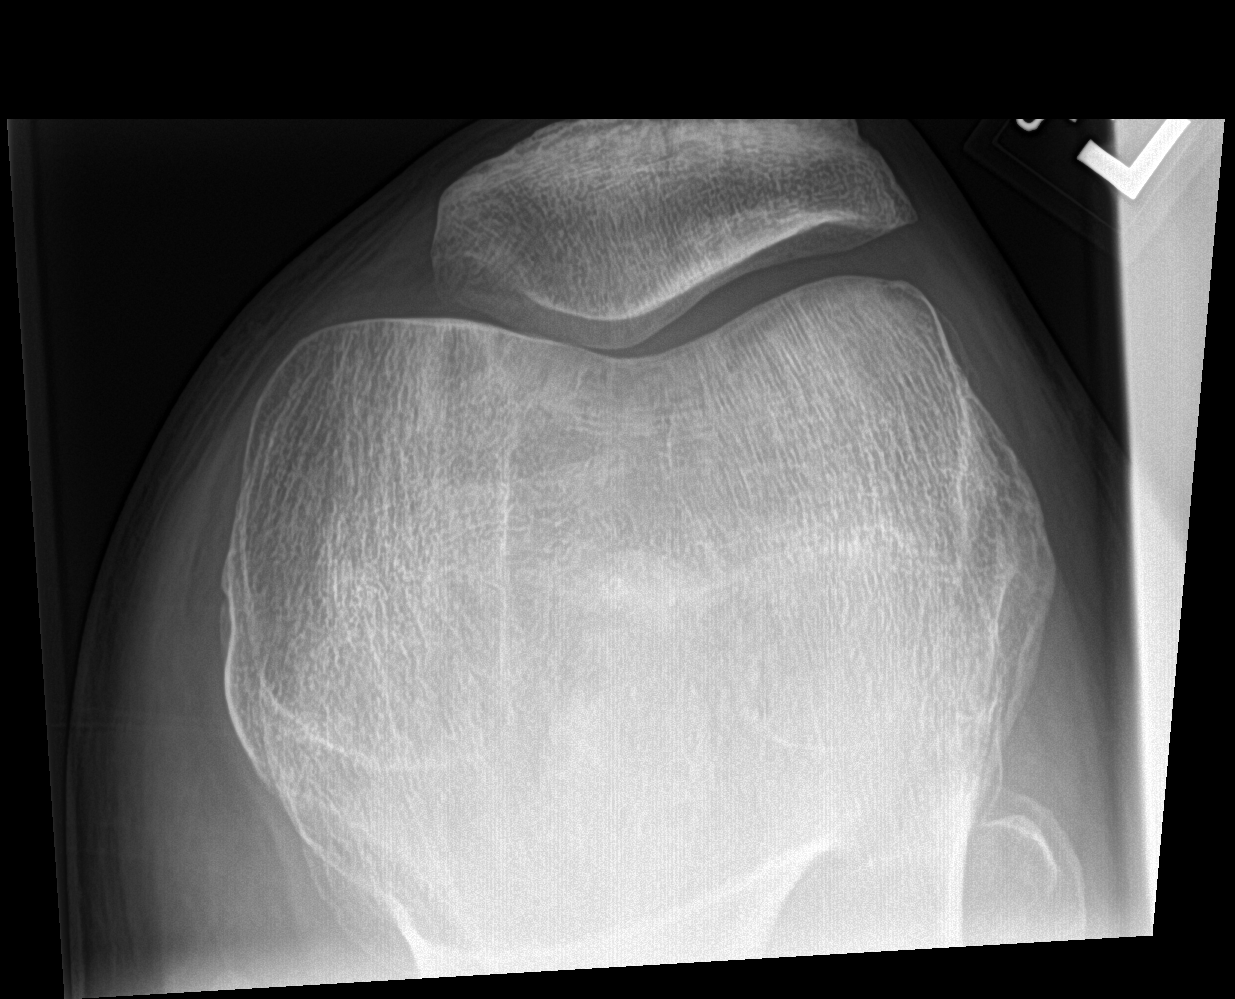

[4 of 4 positions shown; findings below may reference images not displayed]

FINDINGS: There is no evidence of fracture or dislocation. The joint spaces
are preserved. No significant degenerative change is seen; the
patellofemoral joint is grossly unremarkable in appearance.

No significant joint effusion is seen. The visualized soft tissues
are normal in appearance.
IMPRESSION: No evidence of fracture or dislocation.
